# Patient Record
Sex: Female | Born: 1980 | Race: Asian | Hispanic: No | Marital: Married | Smoking: Never smoker
Health system: Southern US, Community
[De-identification: ages and names within clinical notes are randomized; demographics above are authoritative.]

## PROBLEM LIST (undated history)

## (undated) DIAGNOSIS — I499 Cardiac arrhythmia, unspecified: Secondary | ICD-10-CM

## (undated) DIAGNOSIS — N912 Amenorrhea, unspecified: Secondary | ICD-10-CM

## (undated) HISTORY — DX: Amenorrhea, unspecified: N91.2

## (undated) HISTORY — DX: Cardiac arrhythmia, unspecified: I49.9

---

## 2001-08-15 ENCOUNTER — Other Ambulatory Visit: Admission: RE | Admit: 2001-08-15 | Discharge: 2001-08-15 | Payer: Self-pay | Admitting: Obstetrics and Gynecology

## 2003-01-19 ENCOUNTER — Other Ambulatory Visit: Admission: RE | Admit: 2003-01-19 | Discharge: 2003-01-19 | Payer: Self-pay | Admitting: Obstetrics and Gynecology

## 2003-04-29 ENCOUNTER — Encounter: Admission: RE | Admit: 2003-04-29 | Discharge: 2003-04-29 | Payer: Self-pay | Admitting: Family Medicine

## 2004-01-13 ENCOUNTER — Other Ambulatory Visit: Admission: RE | Admit: 2004-01-13 | Discharge: 2004-01-13 | Payer: Self-pay | Admitting: Obstetrics and Gynecology

## 2005-06-15 ENCOUNTER — Other Ambulatory Visit: Admission: RE | Admit: 2005-06-15 | Discharge: 2005-06-15 | Payer: Self-pay | Admitting: Obstetrics and Gynecology

## 2007-08-15 ENCOUNTER — Inpatient Hospital Stay (HOSPITAL_COMMUNITY): Admission: AD | Admit: 2007-08-15 | Discharge: 2007-08-17 | Payer: Self-pay | Admitting: Obstetrics and Gynecology

## 2007-08-18 ENCOUNTER — Ambulatory Visit: Admission: RE | Admit: 2007-08-18 | Discharge: 2007-08-18 | Payer: Self-pay | Admitting: Obstetrics and Gynecology

## 2009-02-28 ENCOUNTER — Inpatient Hospital Stay (HOSPITAL_COMMUNITY): Admission: AD | Admit: 2009-02-28 | Discharge: 2009-02-28 | Payer: Self-pay | Admitting: Obstetrics and Gynecology

## 2009-03-03 ENCOUNTER — Ambulatory Visit: Admission: RE | Admit: 2009-03-03 | Discharge: 2009-03-03 | Payer: Self-pay | Admitting: Obstetrics and Gynecology

## 2009-03-03 ENCOUNTER — Encounter (INDEPENDENT_AMBULATORY_CARE_PROVIDER_SITE_OTHER): Payer: Self-pay | Admitting: Obstetrics and Gynecology

## 2009-03-28 ENCOUNTER — Inpatient Hospital Stay (HOSPITAL_COMMUNITY): Admission: AD | Admit: 2009-03-28 | Discharge: 2009-03-30 | Payer: Self-pay | Admitting: Obstetrics and Gynecology

## 2010-08-30 LAB — CBC
HCT: 28.2 % — ABNORMAL LOW (ref 36.0–46.0)
HCT: 35.8 % — ABNORMAL LOW (ref 36.0–46.0)
Hemoglobin: 12.2 g/dL (ref 12.0–15.0)
Hemoglobin: 9.6 g/dL — ABNORMAL LOW (ref 12.0–15.0)
MCHC: 33.8 g/dL (ref 30.0–36.0)
MCHC: 34.1 g/dL (ref 30.0–36.0)
MCV: 87.1 fL (ref 78.0–100.0)
MCV: 88 fL (ref 78.0–100.0)
Platelets: 159 10*3/uL (ref 150–400)
Platelets: 213 10*3/uL (ref 150–400)
RBC: 3.21 MIL/uL — ABNORMAL LOW (ref 3.87–5.11)
RBC: 4.11 MIL/uL (ref 3.87–5.11)
RDW: 14.4 % (ref 11.5–15.5)
RDW: 14.8 % (ref 11.5–15.5)
WBC: 10.5 10*3/uL (ref 4.0–10.5)
WBC: 12.6 10*3/uL — ABNORMAL HIGH (ref 4.0–10.5)

## 2010-08-30 LAB — RPR: RPR Ser Ql: NONREACTIVE

## 2011-02-19 LAB — CBC
HCT: 26 — ABNORMAL LOW
HCT: 32.5 — ABNORMAL LOW
Hemoglobin: 11.3 — ABNORMAL LOW
Hemoglobin: 9.1 — ABNORMAL LOW
MCHC: 34.8
MCHC: 35.1
MCV: 84
MCV: 84.5
Platelets: 188
Platelets: 215
RBC: 3.08 — ABNORMAL LOW
RBC: 3.87
RDW: 14.2
RDW: 14.6
WBC: 10.9 — ABNORMAL HIGH
WBC: 20 — ABNORMAL HIGH

## 2011-02-19 LAB — RPR: RPR Ser Ql: NONREACTIVE

## 2015-02-01 ENCOUNTER — Other Ambulatory Visit (HOSPITAL_COMMUNITY): Payer: Self-pay | Admitting: Obstetrics and Gynecology

## 2015-02-01 ENCOUNTER — Other Ambulatory Visit (HOSPITAL_COMMUNITY): Payer: Self-pay

## 2015-02-01 DIAGNOSIS — I499 Cardiac arrhythmia, unspecified: Secondary | ICD-10-CM

## 2015-02-02 ENCOUNTER — Ambulatory Visit (HOSPITAL_COMMUNITY)
Admission: RE | Admit: 2015-02-02 | Discharge: 2015-02-02 | Disposition: A | Discharge status: Home / Self Care | Payer: BLUE CROSS/BLUE SHIELD | Source: Ambulatory Visit | Attending: Obstetrics and Gynecology | Admitting: Obstetrics and Gynecology

## 2015-02-02 DIAGNOSIS — R009 Unspecified abnormalities of heart beat: Secondary | ICD-10-CM | POA: Diagnosis present

## 2015-02-02 DIAGNOSIS — I498 Other specified cardiac arrhythmias: Secondary | ICD-10-CM | POA: Diagnosis not present

## 2015-03-07 ENCOUNTER — Ambulatory Visit (HOSPITAL_COMMUNITY): Payer: BLUE CROSS/BLUE SHIELD | Attending: Internal Medicine

## 2015-03-07 ENCOUNTER — Other Ambulatory Visit: Payer: Self-pay

## 2015-03-07 ENCOUNTER — Encounter: Payer: Self-pay | Admitting: Cardiology

## 2015-03-07 ENCOUNTER — Ambulatory Visit (INDEPENDENT_AMBULATORY_CARE_PROVIDER_SITE_OTHER): Payer: BLUE CROSS/BLUE SHIELD | Admitting: Cardiology

## 2015-03-07 VITALS — BP 112/56 | HR 66 | Ht 63.0 in | Wt 125.0 lb

## 2015-03-07 DIAGNOSIS — I313 Pericardial effusion (noninflammatory): Secondary | ICD-10-CM | POA: Diagnosis not present

## 2015-03-07 DIAGNOSIS — R079 Chest pain, unspecified: Secondary | ICD-10-CM

## 2015-03-07 DIAGNOSIS — R002 Palpitations: Secondary | ICD-10-CM

## 2015-03-07 DIAGNOSIS — I517 Cardiomegaly: Secondary | ICD-10-CM | POA: Insufficient documentation

## 2015-03-07 NOTE — Progress Notes (Signed)
Cardiology Office Note   Date:  03/07/2015   ID:  Shawna Perez, DOB Mar 27, 1981, MRN [id number]  PCP:  Redmond Baseman, MD  Cardiologist:   Donato Schultz, MD      History of Present Illness: Shawna Perez is a 34 y.o. female  Here for evaluation of irregular heartbeat noted by her OB/GYN Dr. Arelia Sneddon.  EKG personally reviewed showed sinus bradycardia rate 58 with sinus arrhythmia. In the interpretation he stated that he thinks that this is okay but will refer to cardiologist for further evaluation to be sure. When auscultating her did feel some slight irregularity.  Rarely feels a "little" chest pressure. 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 Age 4. Both parents are MD (Mother eye, Father anesthesia). Rare skipped beats. Can feel skips occasionally.  Sometimes this occurs under increased stress at work.  Dr. Arelia Sneddon felt or heard the heart rate change. Feels tired and concentration  Decreases at times.   She works for [employer].  Overall once to make sure that her heart is okay.   Past Medical History  Diagnosis Date  . Irregular heartbeat   . Amenorrhea     WITH MIRENA IUD    No past surgical history on file.   Current Outpatient Prescriptions  Medication Sig Dispense Refill  . levonorgestrel (MIRENA) 20 MCG/24HR IUD 1 each by Intrauterine route once.     No current facility-administered medications for this visit.    Allergies:   Review of patient's allergies indicates not on file.    Social History:  The patient  reports that she has never smoked. She does not have any smokeless tobacco history on file. She reports that she drinks alcohol.   Family History:  No early CAD.     ROS:  Please see the history of present illness.   Otherwise, review of systems are positive for none.   Previous pharyngitis episodes. All other systems are reviewed and negative.    PHYSICAL EXAM: VS:  BP 112/56 mmHg  Pulse 66  Ht  (1.6 m)  Wt 125 lb (56.7 kg)  BMI 22.15 kg/m2  SpO2 99%   LMP 03/04/2015 , BMI Body mass index is 22.15 kg/(m^2). GEN: Well nourished, well developed, in no acute distress HEENT: normal Neck: no JVD, carotid bruits, or masses Cardiac: RRR; no murmurs, rubs, or gallops,no edema  Respiratory:  clear to auscultation bilaterally, normal work of breathing GI: soft, nontender, nondistended, + BS MS: no deformity or atrophy Skin: warm and dry, no rash Neuro:  Strength and sensation are intact Psych: euthymic mood, full affect   EKG:  EKG reviewed as above. Sinus rhythm with sinus arrhythmia. No PVCs noted however on auscultation these were previously heard.  Recent Labs: No results found for requested labs within last 365 days.    Lipid Panel No results found for: CHOL, TRIG, HDL, CHOLHDL, VLDL, LDLCALC, LDLDIRECT    Wt Readings from Last 3 Encounters:  03/07/15 125 lb (56.7 kg)      Other studies Reviewed: Additional studies/ records that were reviewed today include:  Medical records reviewed.. Review of the above records demonstrates:  As above   ASSESSMENT AND PLAN:  1.   Palpitations -  I agree with Dr. Arelia Sneddon, likely PVC , not detected on EKG. No high risk symptoms such as syncope. She does have mild chest pressure at times and with her prior history of myocarditis, I will check an echocardiogram to ensure proper structure and function of her heart. Try  to avoid excessive caffeine use. Sleep hygiene is important. If symptoms worsen, could consider monitoring in the future but I do not think that this is warranted at this time.   2. Atypical chest pain/pressure- Checking echo.     Current medicines are reviewed at length with the patient today.  The patient does not have concerns regarding medicines.  The following changes have been made:  no change  Labs/ tests ordered today include:   Orders Placed This Encounter  Procedures  . Echocardiogram     Disposition:   We will follow-up with echo results.  Mathews Robinsons, MD  03/07/2015 11:45 AM    [facility] [address] [place], [place], [place]  [address] Phone: [phone]; Fax: [phone]

## 2015-03-07 NOTE — Patient Instructions (Signed)
Medication Instructions:  The current medical regimen is effective;  continue present plan and medications.  Testing/Procedures: Your physician has requested that you have an echocardiogram. Echocardiography is a painless test that uses sound waves to create images of your heart. It provides your doctor with information about the size and shape of your heart and how well your heart's chambers and valves are working. This procedure takes approximately one hour. There are no restrictions for this procedure.  Follow-Up: Follow up as needed after testing.  Thank you for choosing Shawna Perez!!     

## 2017-02-05 ENCOUNTER — Other Ambulatory Visit: Payer: Self-pay | Admitting: Obstetrics and Gynecology

## 2017-02-05 DIAGNOSIS — N63 Unspecified lump in unspecified breast: Secondary | ICD-10-CM

## 2017-02-14 ENCOUNTER — Ambulatory Visit
Admission: RE | Admit: 2017-02-14 | Discharge: 2017-02-14 | Disposition: A | Discharge status: Home / Self Care | Payer: 59 | Source: Ambulatory Visit | Attending: Obstetrics and Gynecology | Admitting: Obstetrics and Gynecology

## 2017-02-14 ENCOUNTER — Other Ambulatory Visit: Payer: Self-pay | Admitting: Obstetrics and Gynecology

## 2017-02-14 DIAGNOSIS — N63 Unspecified lump in unspecified breast: Secondary | ICD-10-CM

## 2017-02-22 ENCOUNTER — Other Ambulatory Visit: Payer: Self-pay | Admitting: Obstetrics and Gynecology

## 2017-02-22 DIAGNOSIS — N63 Unspecified lump in unspecified breast: Secondary | ICD-10-CM

## 2017-02-25 ENCOUNTER — Ambulatory Visit
Admission: RE | Admit: 2017-02-25 | Discharge: 2017-02-25 | Disposition: A | Discharge status: Home / Self Care | Payer: 59 | Source: Ambulatory Visit | Attending: Obstetrics and Gynecology | Admitting: Obstetrics and Gynecology

## 2017-02-25 DIAGNOSIS — N63 Unspecified lump in unspecified breast: Secondary | ICD-10-CM

## 2017-02-25 HISTORY — PX: BREAST BIOPSY: SHX20

## 2018-04-09 ENCOUNTER — Other Ambulatory Visit: Payer: Self-pay | Admitting: Obstetrics and Gynecology

## 2018-04-09 DIAGNOSIS — N6320 Unspecified lump in the left breast, unspecified quadrant: Secondary | ICD-10-CM

## 2018-04-11 ENCOUNTER — Ambulatory Visit
Admission: RE | Admit: 2018-04-11 | Discharge: 2018-04-11 | Disposition: A | Discharge status: Home / Self Care | Payer: 59 | Source: Ambulatory Visit | Attending: Obstetrics and Gynecology | Admitting: Obstetrics and Gynecology

## 2018-04-11 ENCOUNTER — Ambulatory Visit: Admission: RE | Admit: 2018-04-11 | Payer: 59 | Source: Ambulatory Visit

## 2018-04-11 DIAGNOSIS — N6320 Unspecified lump in the left breast, unspecified quadrant: Secondary | ICD-10-CM

## 2019-05-26 IMAGING — MG DIGITAL DIAGNOSTIC BILATERAL MAMMOGRAM WITH TOMO AND CAD
8 series · 9 of 24 positions shown · non-contrast
Comparison: Previous exam(s).

CLINICAL DATA: 37-year-old female with biopsy-proven LEFT breast
fibroadenoma.

EXAM:
DIGITAL DIAGNOSTIC BILATERAL MAMMOGRAM WITH CAD AND TOMO

[R MLO synth-2D]
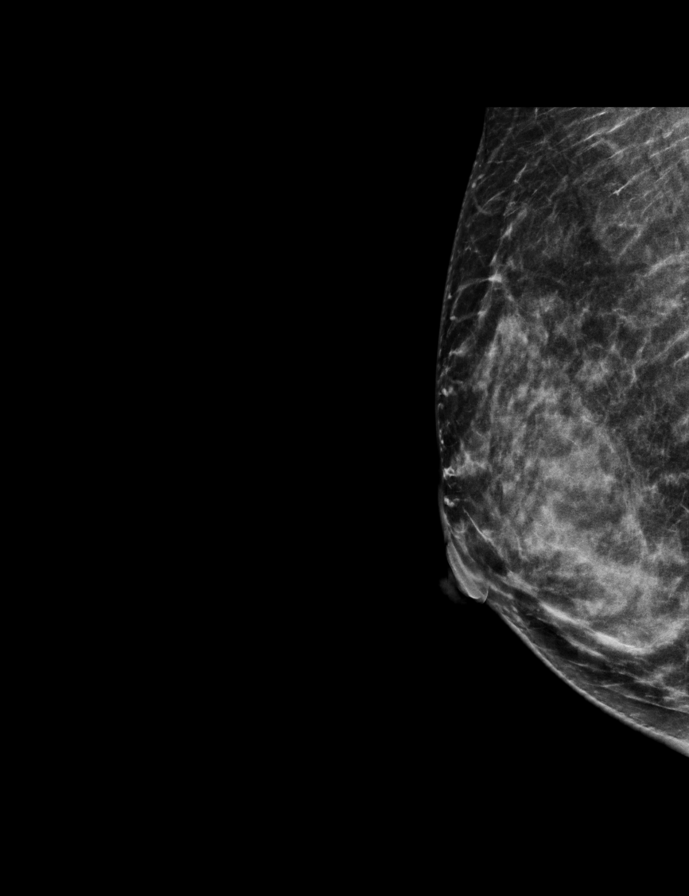

[L MLO synth-2D]
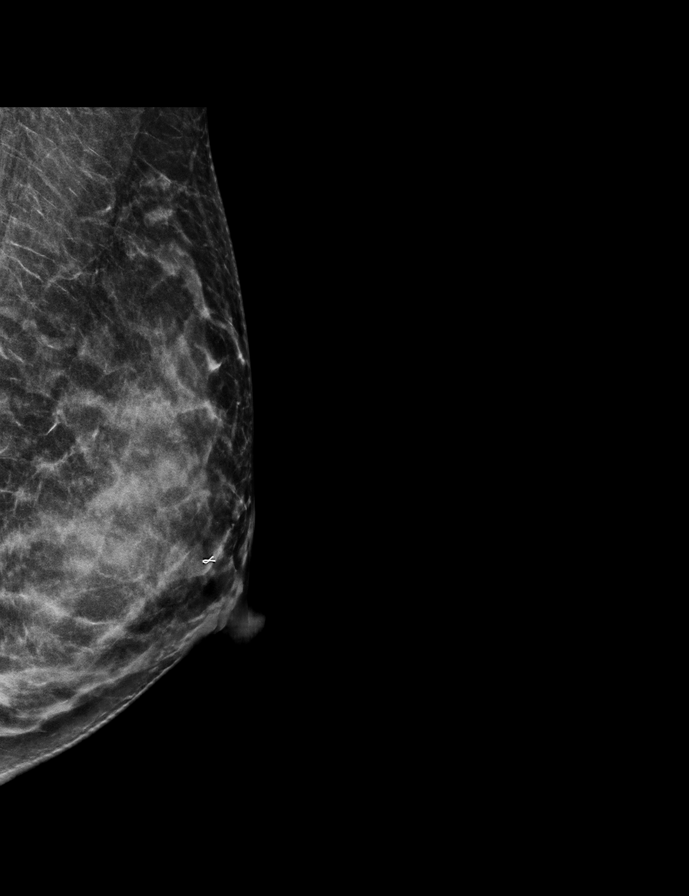

[R CC synth-2D]
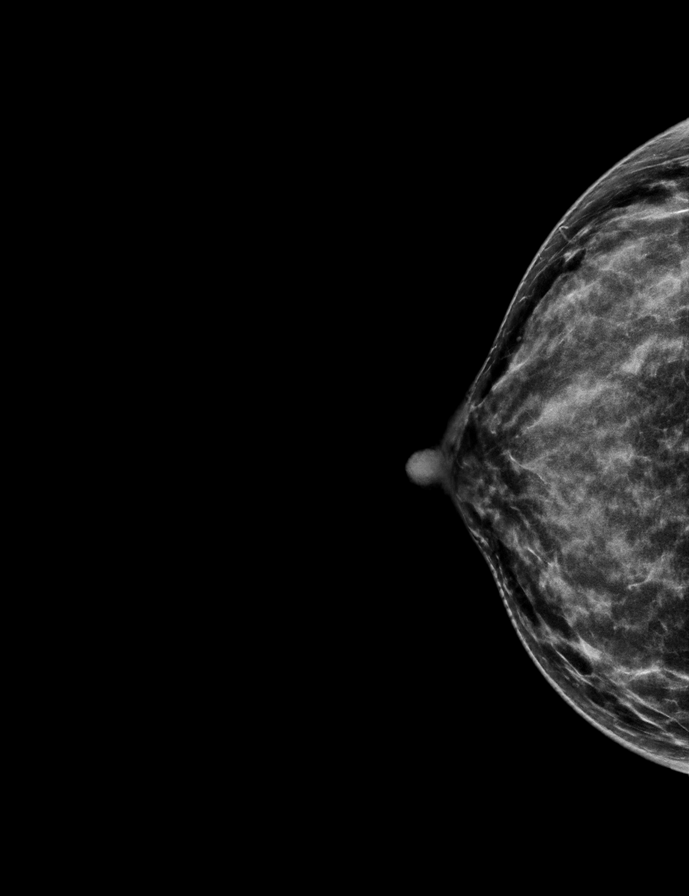

[L CC synth-2D]
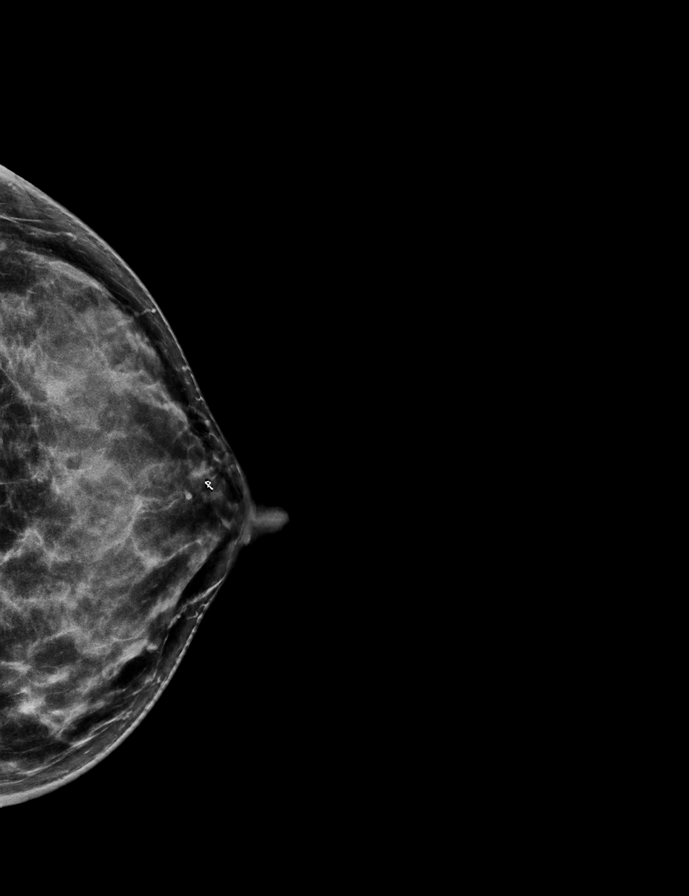

[L MLO tomo · 2 of 56 frames shown]
[frame 19/56]
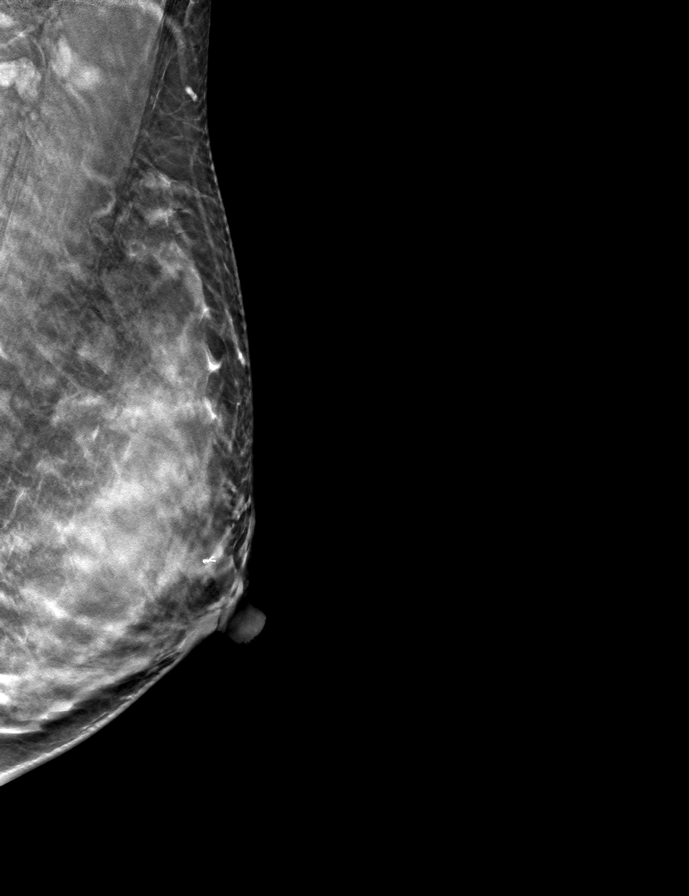
[frame 29/56]
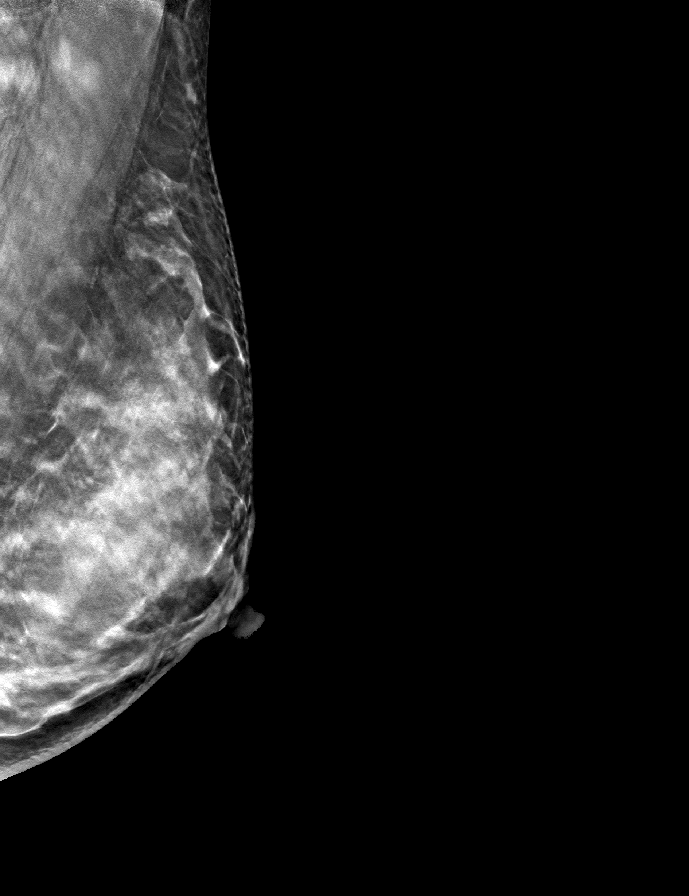

[R MLO tomo · tomo slice 29/56.0]
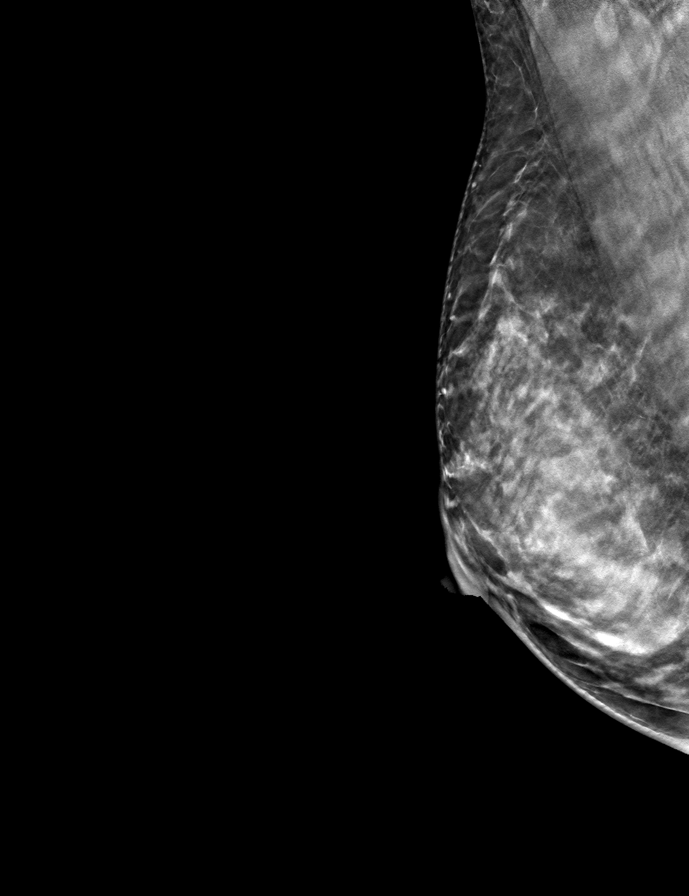

[R CC tomo · tomo slice 27/54.0]
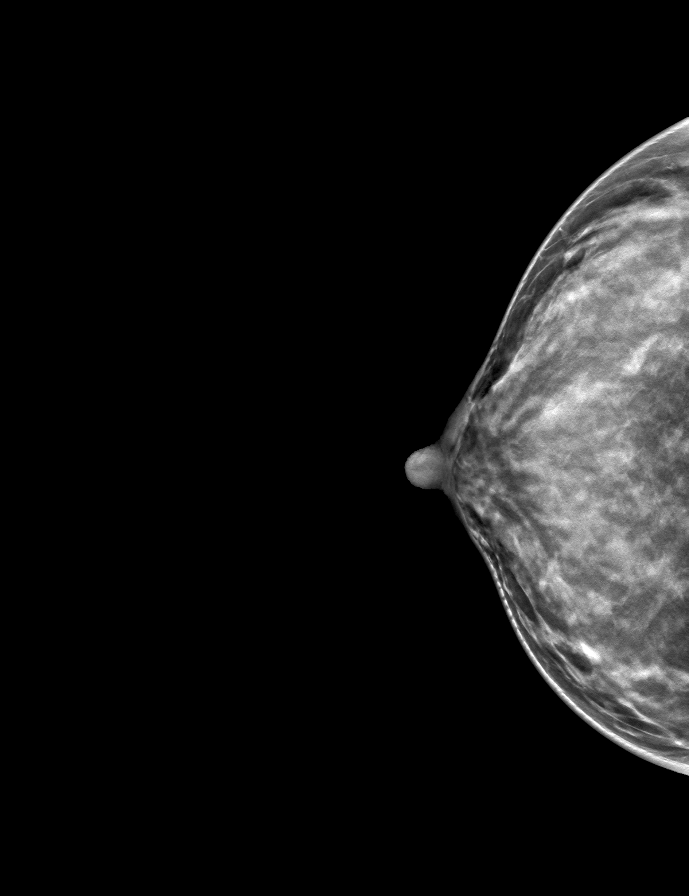

[L CC tomo · tomo slice 30/59.0]
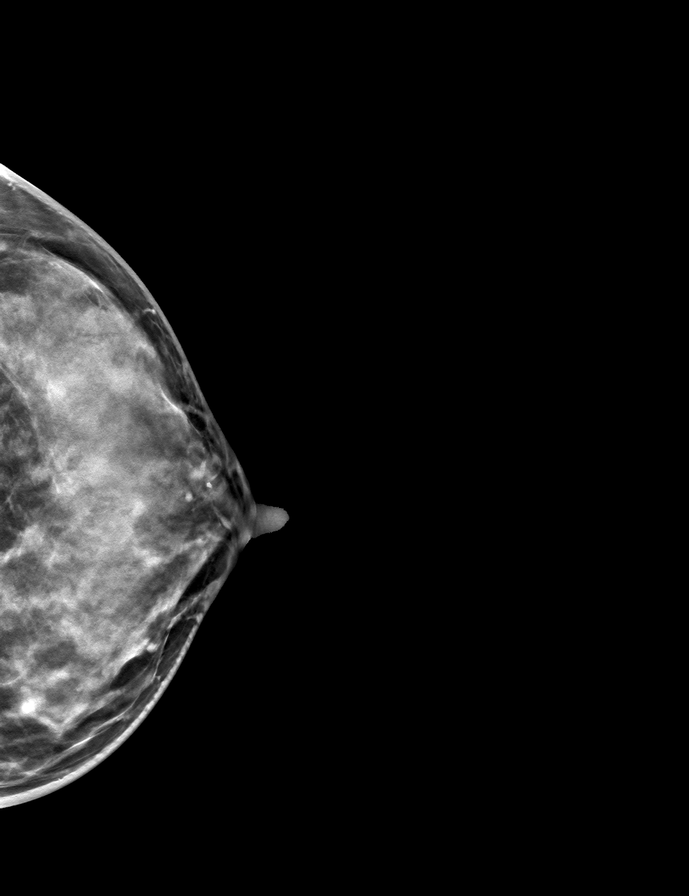

[9 of 24 positions shown; findings below may reference images not displayed]

ACR Breast Density Category c: The breast tissue is heterogeneously
dense, which may obscure small masses.
FINDINGS: 2D/3D full field views of both breast demonstrate a stable
circumscribed oval mass within the UPPER-OUTER RETROAREOLAR LEFT
breast with biopsy clip, compatible with biopsy-proven fibroadenoma.

No suspicious mass, distortion or worrisome calcifications are
noted.

Mammographic images were processed with CAD.
IMPRESSION: 1. Stable biopsy-proven LEFT breast fibroadenoma. No further imaging
follow-up recommended.
2. No mammographic evidence of breast malignancy.

RECOMMENDATION:
Bilateral screening mammogram at age 39

I have discussed the findings and recommendations with the patient.
Results were also provided in writing at the conclusion of the
visit. If applicable, a reminder letter will be sent to the patient
regarding the next appointment.

BI-RADS CATEGORY  2: Benign.

## 2020-04-27 ENCOUNTER — Other Ambulatory Visit (HOSPITAL_COMMUNITY): Payer: Self-pay | Admitting: Internal Medicine

## 2020-04-27 ENCOUNTER — Ambulatory Visit: Payer: 59 | Attending: Internal Medicine

## 2020-04-27 DIAGNOSIS — Z23 Encounter for immunization: Secondary | ICD-10-CM

## 2020-04-27 NOTE — Progress Notes (Signed)
° °  Covid-19 Vaccination Clinic  Name:  Comfort Iversen    MRN: [id number] DOB: Feb 18, 1981  04/27/2020  Ms. Jimenez was observed post Covid-19 immunization for 15 minutes without incident. She was provided with Vaccine Information Sheet and instruction to access the V-Safe system.   Ms. Cannell was instructed to call 911 with any severe reactions post vaccine:  Difficulty breathing   Swelling of face and throat   A fast heartbeat   A bad rash all over body   Dizziness and weakness   Immunizations Administered    Name Date Dose VIS Date Route   Pfizer COVID-19 Vaccine 04/27/2020  9:27 AM 0.3 mL 03/16/2020 Intramuscular   Manufacturer: [employer], [employer]   Lot: [id number]   NDC: [address]-7

## 2020-05-18 ENCOUNTER — Ambulatory Visit: Payer: 59

## 2020-11-16 ENCOUNTER — Ambulatory Visit: Payer: 59 | Admitting: Emergency Medicine

## 2023-04-11 ENCOUNTER — Encounter: Payer: Self-pay | Admitting: Nurse Practitioner

## 2023-04-11 ENCOUNTER — Other Ambulatory Visit: Payer: Self-pay | Admitting: Obstetrics and Gynecology

## 2023-04-11 DIAGNOSIS — R928 Other abnormal and inconclusive findings on diagnostic imaging of breast: Secondary | ICD-10-CM

## 2023-04-29 ENCOUNTER — Other Ambulatory Visit: Payer: 59

## 2023-04-29 ENCOUNTER — Ambulatory Visit
Admission: RE | Admit: 2023-04-29 | Discharge: 2023-04-29 | Disposition: A | Discharge status: Home / Self Care | Payer: 59 | Source: Ambulatory Visit | Attending: Obstetrics and Gynecology | Admitting: Obstetrics and Gynecology

## 2023-04-29 DIAGNOSIS — R928 Other abnormal and inconclusive findings on diagnostic imaging of breast: Secondary | ICD-10-CM

## 2024-04-08 ENCOUNTER — Other Ambulatory Visit: Payer: Self-pay | Admitting: Obstetrics and Gynecology

## 2024-04-08 DIAGNOSIS — Z1231 Encounter for screening mammogram for malignant neoplasm of breast: Secondary | ICD-10-CM

## 2024-05-07 ENCOUNTER — Ambulatory Visit
Admission: RE | Admit: 2024-05-07 | Discharge: 2024-05-07 | Disposition: A | Discharge status: Home / Self Care | Source: Ambulatory Visit | Attending: Obstetrics and Gynecology | Admitting: Obstetrics and Gynecology

## 2024-05-07 DIAGNOSIS — Z1231 Encounter for screening mammogram for malignant neoplasm of breast: Secondary | ICD-10-CM

## 2024-05-13 ENCOUNTER — Other Ambulatory Visit: Payer: Self-pay | Admitting: Obstetrics and Gynecology

## 2024-05-13 DIAGNOSIS — R928 Other abnormal and inconclusive findings on diagnostic imaging of breast: Secondary | ICD-10-CM

## 2024-06-01 ENCOUNTER — Ambulatory Visit
Admission: RE | Admit: 2024-06-01 | Discharge: 2024-06-01 | Disposition: A | Discharge status: Home / Self Care | Source: Ambulatory Visit | Attending: Obstetrics and Gynecology | Admitting: Obstetrics and Gynecology

## 2024-06-01 ENCOUNTER — Encounter

## 2024-06-01 ENCOUNTER — Ambulatory Visit

## 2024-06-01 DIAGNOSIS — R928 Other abnormal and inconclusive findings on diagnostic imaging of breast: Secondary | ICD-10-CM

## 2024-06-29 ENCOUNTER — Encounter: Payer: Self-pay | Admitting: Gastroenterology

## 2024-09-16 ENCOUNTER — Ambulatory Visit: Admitting: Neurology
